# Patient Record
Sex: Male | Born: 1990 | Race: White | Hispanic: No | Marital: Single | Smoking: Current every day smoker
Health system: Southern US, Community
[De-identification: ages and names within clinical notes are randomized; demographics above are authoritative.]

---
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
## 2020-06-06 ENCOUNTER — Encounter (HOSPITAL_COMMUNITY): Payer: Self-pay | Admitting: Emergency Medicine

## 2020-06-06 ENCOUNTER — Emergency Department (HOSPITAL_COMMUNITY)
Admission: EM | Admit: 2020-06-06 | Discharge: 2020-06-06 | Payer: Self-pay | Attending: Emergency Medicine | Admitting: Emergency Medicine

## 2020-06-06 ENCOUNTER — Other Ambulatory Visit: Payer: Self-pay

## 2020-06-06 ENCOUNTER — Emergency Department (HOSPITAL_COMMUNITY): Payer: Self-pay

## 2020-06-06 DIAGNOSIS — W228XXA Striking against or struck by other objects, initial encounter: Secondary | ICD-10-CM | POA: Insufficient documentation

## 2020-06-06 DIAGNOSIS — F17200 Nicotine dependence, unspecified, uncomplicated: Secondary | ICD-10-CM | POA: Insufficient documentation

## 2020-06-06 DIAGNOSIS — S6291XP Unspecified fracture of right wrist and hand, subsequent encounter for fracture with malunion: Secondary | ICD-10-CM

## 2020-06-06 DIAGNOSIS — S60921A Unspecified superficial injury of right hand, initial encounter: Secondary | ICD-10-CM | POA: Insufficient documentation

## 2020-06-06 DIAGNOSIS — Y999 Unspecified external cause status: Secondary | ICD-10-CM | POA: Insufficient documentation

## 2020-06-06 DIAGNOSIS — Y929 Unspecified place or not applicable: Secondary | ICD-10-CM | POA: Insufficient documentation

## 2020-06-06 DIAGNOSIS — Y9389 Activity, other specified: Secondary | ICD-10-CM | POA: Insufficient documentation

## 2020-06-06 LAB — CBC WITH DIFFERENTIAL/PLATELET
Abs Immature Granulocytes: 0.03 10*3/uL (ref 0.00–0.07)
Basophils Absolute: 0.1 10*3/uL (ref 0.0–0.1)
Basophils Relative: 1 %
Eosinophils Absolute: 0.1 10*3/uL (ref 0.0–0.5)
Eosinophils Relative: 0 %
HCT: 44.9 % (ref 39.0–52.0)
Hemoglobin: 14.8 g/dL (ref 13.0–17.0)
Immature Granulocytes: 0 %
Lymphocytes Relative: 17 %
Lymphs Abs: 1.9 10*3/uL (ref 0.7–4.0)
MCH: 28.8 pg (ref 26.0–34.0)
MCHC: 33 g/dL (ref 30.0–36.0)
MCV: 87.4 fL (ref 80.0–100.0)
Monocytes Absolute: 0.8 10*3/uL (ref 0.1–1.0)
Monocytes Relative: 7 %
Neutro Abs: 8.4 10*3/uL — ABNORMAL HIGH (ref 1.7–7.7)
Neutrophils Relative %: 75 %
Platelets: 312 10*3/uL (ref 150–400)
RBC: 5.14 MIL/uL (ref 4.22–5.81)
RDW: 14.6 % (ref 11.5–15.5)
WBC: 11.2 10*3/uL — ABNORMAL HIGH (ref 4.0–10.5)
nRBC: 0 % (ref 0.0–0.2)

## 2020-06-06 LAB — COMPREHENSIVE METABOLIC PANEL
ALT: 97 U/L — ABNORMAL HIGH (ref 0–44)
AST: 81 U/L — ABNORMAL HIGH (ref 15–41)
Albumin: 4.2 g/dL (ref 3.5–5.0)
Alkaline Phosphatase: 60 U/L (ref 38–126)
Anion gap: 12 (ref 5–15)
BUN: 14 mg/dL (ref 6–20)
CO2: 20 mmol/L — ABNORMAL LOW (ref 22–32)
Calcium: 8.7 mg/dL — ABNORMAL LOW (ref 8.9–10.3)
Chloride: 107 mmol/L (ref 98–111)
Creatinine, Ser: 0.95 mg/dL (ref 0.61–1.24)
GFR calc Af Amer: >60 mL/min (ref >60)
GFR calc non Af Amer: >60 mL/min (ref >60)
Glucose, Bld: 119 mg/dL — ABNORMAL HIGH (ref 70–99)
Potassium: 3.2 mmol/L — ABNORMAL LOW (ref 3.5–5.1)
Sodium: 139 mmol/L (ref 135–145)
Total Bilirubin: 0.4 mg/dL (ref 0.3–1.2)
Total Protein: 7.4 g/dL (ref 6.5–8.1)

## 2020-06-06 LAB — ETHANOL: Alcohol, Ethyl (B): 172 mg/dL — ABNORMAL HIGH (ref <10)

## 2020-06-06 MED ORDER — HALOPERIDOL LACTATE 5 MG/ML IJ SOLN: 5.0000 mg | Freq: Once | INTRAMUSCULAR | Status: DC

## 2020-06-06 MED ORDER — POTASSIUM CHLORIDE CRYS ER 20 MEQ PO TBCR
40.0000 meq | EXTENDED_RELEASE_TABLET | Freq: Once | ORAL | Status: AC
  Administered 2020-06-06: 40 meq via ORAL
  Filled 2020-06-06: qty 2
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
## 2020-06-22 ENCOUNTER — Encounter (HOSPITAL_COMMUNITY): Payer: Self-pay

## 2020-06-22 ENCOUNTER — Emergency Department (HOSPITAL_COMMUNITY): Payer: Self-pay

## 2020-06-22 ENCOUNTER — Other Ambulatory Visit: Payer: Self-pay

## 2020-06-22 ENCOUNTER — Emergency Department (HOSPITAL_COMMUNITY)
Admission: EM | Admit: 2020-06-22 | Discharge: 2020-06-22 | Payer: Self-pay | Attending: Emergency Medicine | Admitting: Emergency Medicine

## 2020-06-22 DIAGNOSIS — IMO0002 Reserved for concepts with insufficient information to code with codable children: Secondary | ICD-10-CM

## 2020-06-22 DIAGNOSIS — Y92148 Other place in prison as the place of occurrence of the external cause: Secondary | ICD-10-CM | POA: Insufficient documentation

## 2020-06-22 DIAGNOSIS — W228XXA Striking against or struck by other objects, initial encounter: Secondary | ICD-10-CM | POA: Insufficient documentation

## 2020-06-22 DIAGNOSIS — F17200 Nicotine dependence, unspecified, uncomplicated: Secondary | ICD-10-CM | POA: Insufficient documentation

## 2020-06-22 DIAGNOSIS — F1590 Other stimulant use, unspecified, uncomplicated: Secondary | ICD-10-CM | POA: Insufficient documentation

## 2020-06-22 DIAGNOSIS — S0081XA Abrasion of other part of head, initial encounter: Secondary | ICD-10-CM | POA: Insufficient documentation

## 2020-06-22 DIAGNOSIS — Z7289 Other problems related to lifestyle: Secondary | ICD-10-CM | POA: Insufficient documentation

## 2020-06-22 DIAGNOSIS — S0083XA Contusion of other part of head, initial encounter: Secondary | ICD-10-CM | POA: Insufficient documentation

## 2020-06-22 DIAGNOSIS — F10929 Alcohol use, unspecified with intoxication, unspecified: Secondary | ICD-10-CM | POA: Insufficient documentation
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
## 2022-05-01 IMAGING — CT CT CERVICAL SPINE W/O CM
3 of 4 series · 10 of 33 positions shown, 12 images · non-contrast
Comparison: None.

CLINICAL DATA: Facial trauma

EXAM:
CT HEAD WITHOUT CONTRAST
CT CERVICAL SPINE WITHOUT CONTRAST
TECHNIQUE: Multidetector CT imaging of the head and cervical spine was
performed following the standard protocol without intravenous
contrast. Multiplanar CT image reconstructions of the cervical spine
were also generated.

[Series 4: sagittal bone · sagittal · 0.20mm/px · 5 of 61 slices shown, 6 images]
[im 21/61  bone]
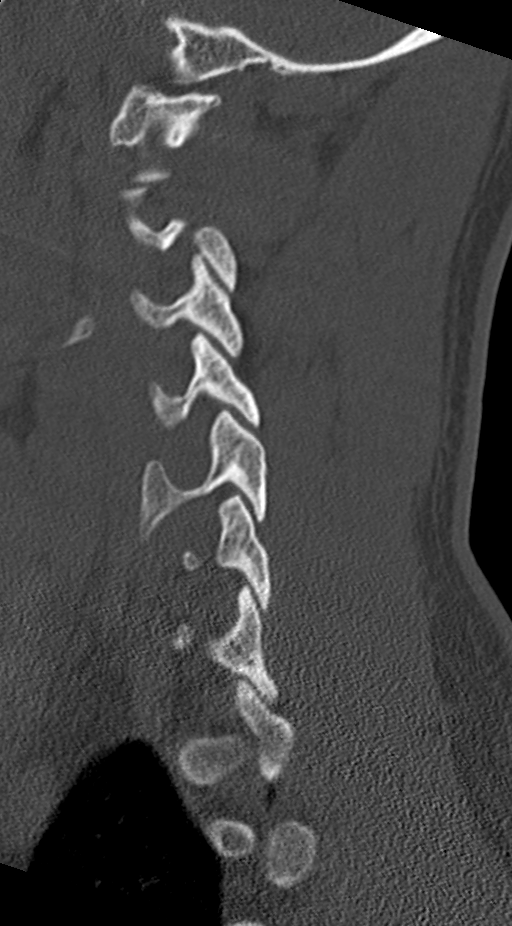
[im 26/61  bone]
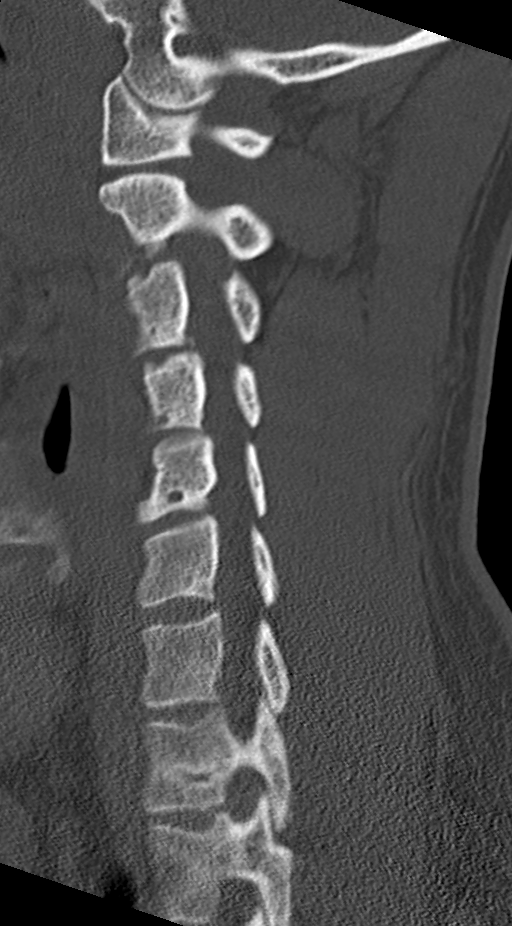
[im 31/61  soft-tissue]
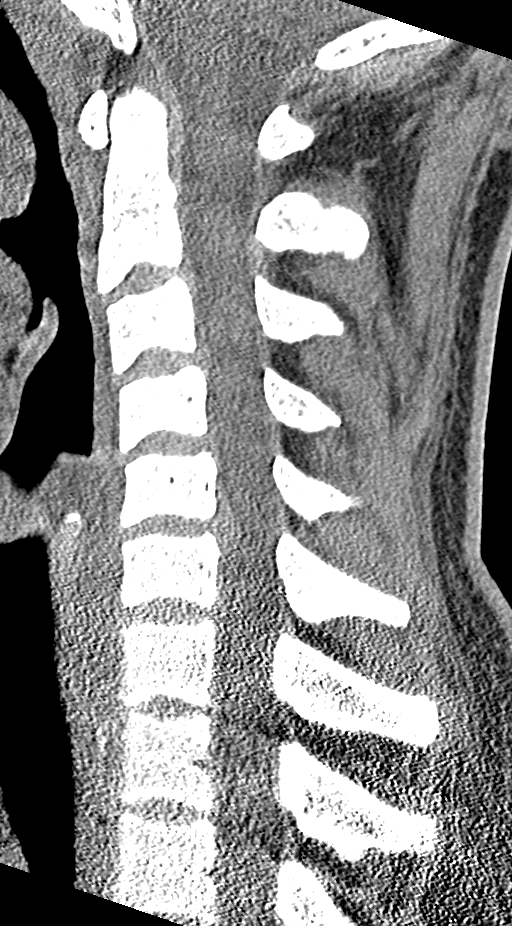
[im 31/61  bone]
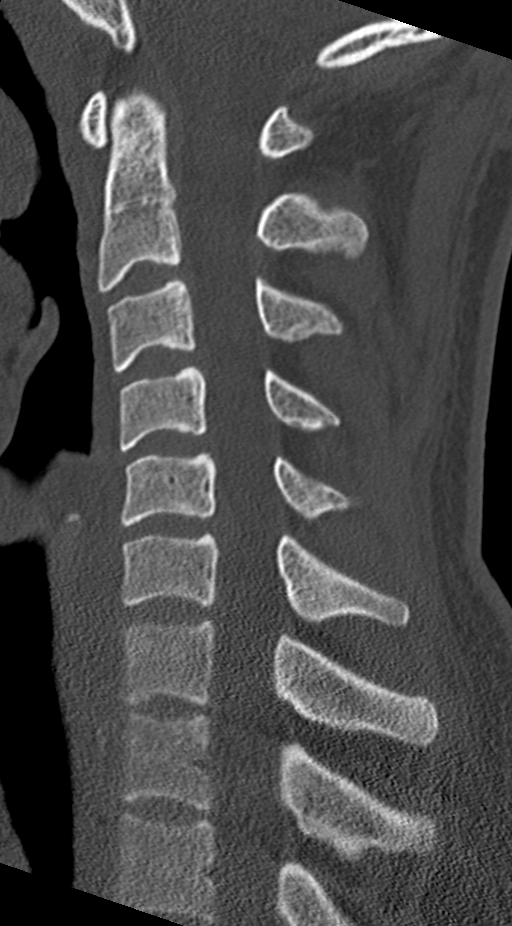
[im 36/61  bone]
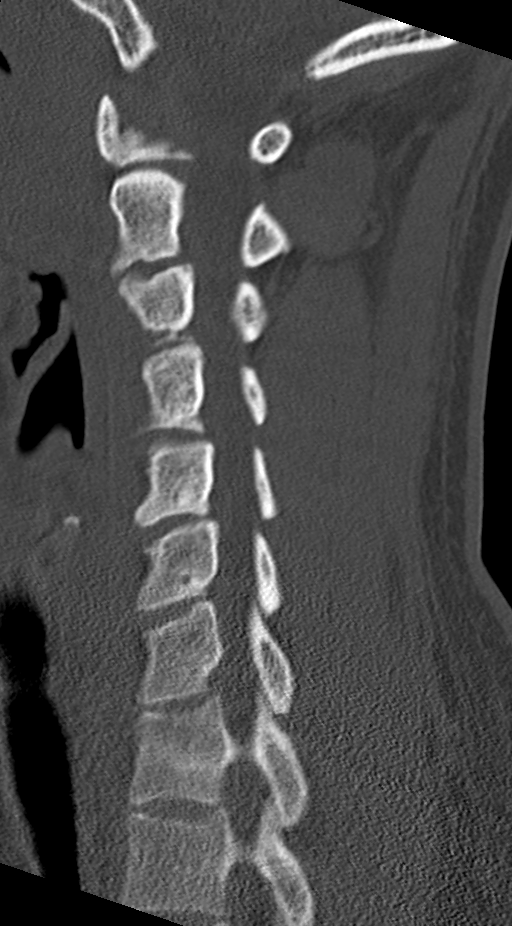
[im 41/61  bone]
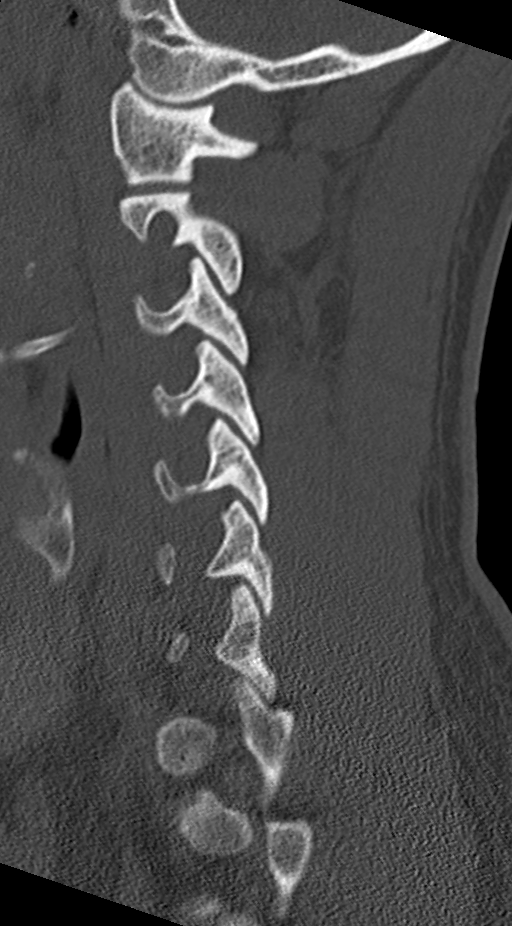

[Series 7: orthogonal bone · axial · 0.19mm/px · z∈[+48,+107]mm · 2 of 95 slices shown, 3 images]
[im 32/95  soft-tissue]
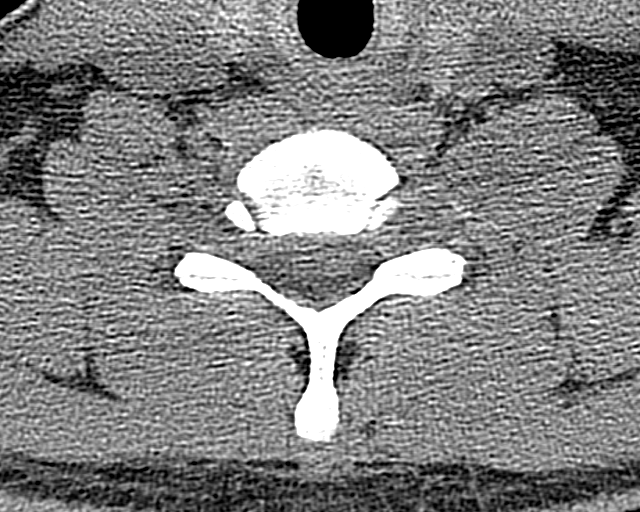
[im 32/95  bone]
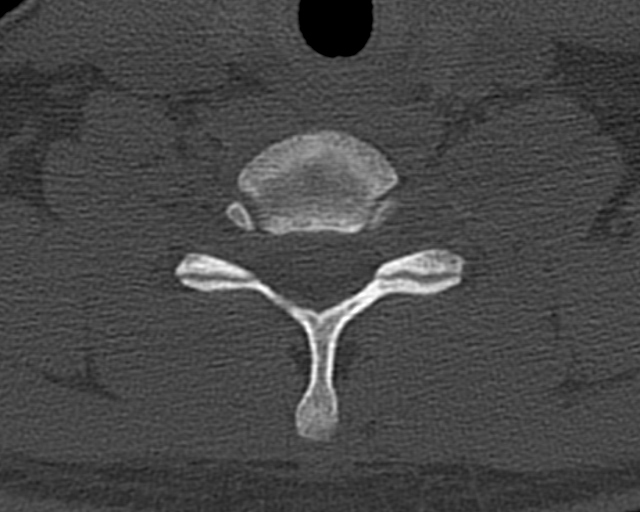
[im 63/95  bone]
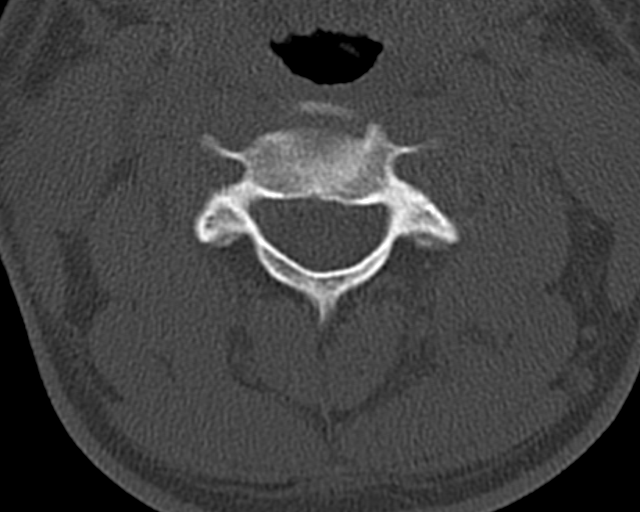

[Series 8: coronal bone · coronal · 0.28mm/px · 3 of 47 slices shown]
[im 10/47  bone]
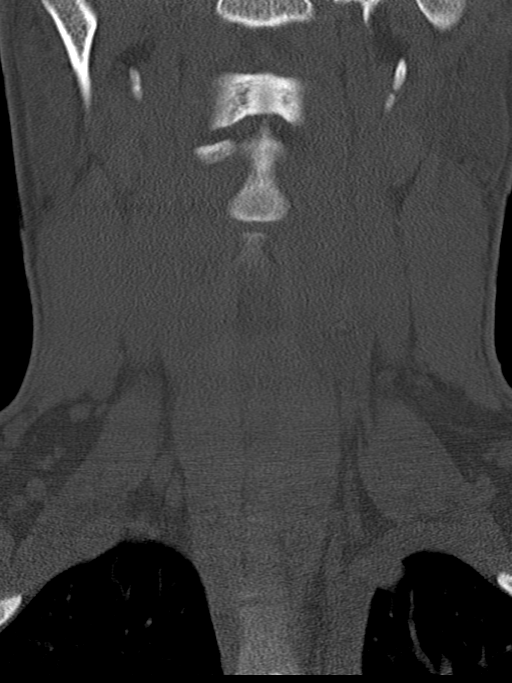
[im 19/47  bone]
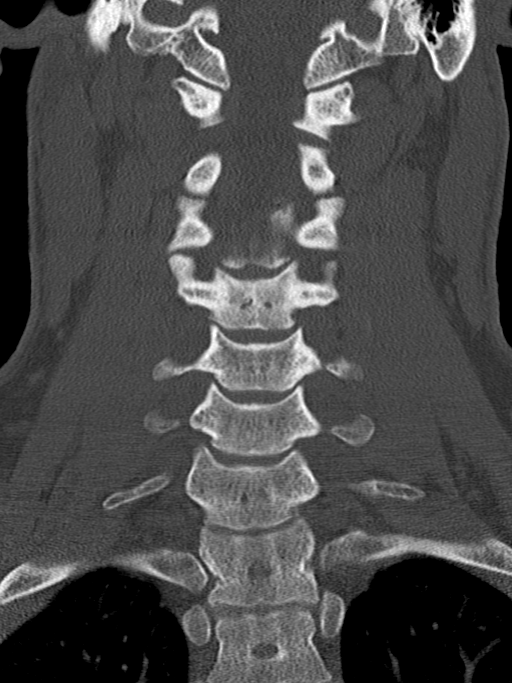
[im 28/47  bone]
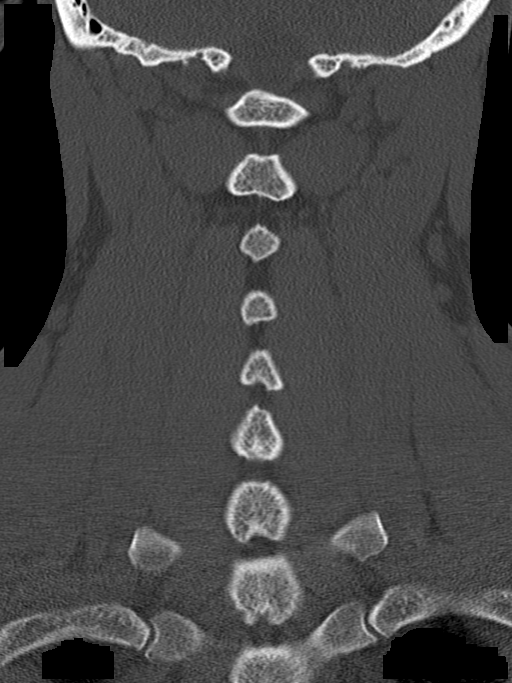

[10 of 33 positions shown; findings below may reference images not displayed]

FINDINGS: CT HEAD FINDINGS

Brain: Normal anatomic configuration. No abnormal intra or
extra-axial mass lesion or fluid collection. No abnormal mass effect
or midline shift. No evidence of acute intracranial hemorrhage or
infarct. Ventricular size is normal. Cerebellum unremarkable.

Vascular: Unremarkable

Skull: Intact

Sinuses/Orbits: Paranasal sinuses are clear. Orbits are
unremarkable.

Other: Mastoid air cells and middle ear cavities are clear. Small
frontal scalp hematoma noted. Mild soft tissue swelling superficial
to the nasion.

CT CERVICAL SPINE FINDINGS

Alignment: Normal.

Skull base and vertebrae: No acute fracture. No primary bone lesion
or focal pathologic process.

Soft tissues and spinal canal: No prevertebral fluid or swelling. No
visible canal hematoma.

Disc levels: Review of the sagittal reformats demonstrates
preservation of vertebral body height and intervertebral disc
height. Review of the axial images demonstrates a broad-based
posterior disc bulge at C5-6 resulting in mild to moderate central
canal stenosis with the AP diameter of the spinal canal of 7-8 mm
and flattening of the thecal sac. There is no significant
uncovertebral or facet arthrosis. No significant neural foraminal
narrowing.

Upper chest: Unremarkable

Other: None significant
IMPRESSION: No acute intracranial abnormality. No calvarial fracture. Small
frontal scalp hematoma.

No acute fracture of the cervical spine.

Broad-based disc bulge at C5-6 resulting in mild-to-moderate central
canal stenosis.

## 2024-10-06 ENCOUNTER — Encounter (HOSPITAL_COMMUNITY): Payer: Self-pay

## 2024-10-06 ENCOUNTER — Other Ambulatory Visit: Payer: Self-pay

## 2024-10-06 ENCOUNTER — Emergency Department (HOSPITAL_COMMUNITY)

## 2024-10-06 ENCOUNTER — Emergency Department (HOSPITAL_COMMUNITY)
Admission: EM | Admit: 2024-10-06 | Discharge: 2024-10-06 | Disposition: A | Discharge status: Home / Self Care | Attending: Emergency Medicine | Admitting: Emergency Medicine

## 2024-10-06 DIAGNOSIS — F17200 Nicotine dependence, unspecified, uncomplicated: Secondary | ICD-10-CM | POA: Diagnosis not present

## 2024-10-06 DIAGNOSIS — S51812A Laceration without foreign body of left forearm, initial encounter: Secondary | ICD-10-CM | POA: Insufficient documentation

## 2024-10-06 DIAGNOSIS — W260XXA Contact with knife, initial encounter: Secondary | ICD-10-CM | POA: Diagnosis not present

## 2024-10-06 DIAGNOSIS — S59912A Unspecified injury of left forearm, initial encounter: Secondary | ICD-10-CM | POA: Diagnosis present

## 2024-10-06 LAB — CBC WITH DIFFERENTIAL/PLATELET
Abs Immature Granulocytes: 0.03 K/uL (ref 0.00–0.07)
Basophils Absolute: 0.1 K/uL (ref 0.0–0.1)
Basophils Relative: 1 %
Eosinophils Absolute: 0.1 K/uL (ref 0.0–0.5)
Eosinophils Relative: 1 %
HCT: 44.5 % (ref 39.0–52.0)
Hemoglobin: 15 g/dL (ref 13.0–17.0)
Immature Granulocytes: 0 %
Lymphocytes Relative: 20 %
Lymphs Abs: 2 K/uL (ref 0.7–4.0)
MCH: 28.1 pg (ref 26.0–34.0)
MCHC: 33.7 g/dL (ref 30.0–36.0)
MCV: 83.5 fL (ref 80.0–100.0)
Monocytes Absolute: 0.9 K/uL (ref 0.1–1.0)
Monocytes Relative: 9 %
Neutro Abs: 6.8 K/uL (ref 1.7–7.7)
Neutrophils Relative %: 69 %
Platelets: 352 K/uL (ref 150–400)
RBC: 5.33 MIL/uL (ref 4.22–5.81)
RDW: 13.6 % (ref 11.5–15.5)
WBC: 10 K/uL (ref 4.0–10.5)
nRBC: 0 % (ref 0.0–0.2)

## 2024-10-06 LAB — BASIC METABOLIC PANEL WITH GFR
Anion gap: 15 (ref 5–15)
BUN: 10 mg/dL (ref 6–20)
CO2: 23 mmol/L (ref 22–32)
Calcium: 9.3 mg/dL (ref 8.9–10.3)
Chloride: 103 mmol/L (ref 98–111)
Creatinine, Ser: 1.05 mg/dL (ref 0.61–1.24)
GFR, Estimated: >60 mL/min
Glucose, Bld: 102 mg/dL — ABNORMAL HIGH (ref 70–99)
Potassium: 3.7 mmol/L (ref 3.5–5.1)
Sodium: 141 mmol/L (ref 135–145)

## 2024-10-06 MED ORDER — LIDOCAINE-EPINEPHRINE (PF) 2 %-1:200000 IJ SOLN
20.0000 mL | Freq: Once | INTRAMUSCULAR | Status: DC
  Filled 2024-10-06: qty 20

## 2024-10-06 MED ORDER — TETANUS-DIPHTH-ACELL PERTUSSIS 5-2-15.5 LF-MCG/0.5 IM SUSP: 0.5000 mL | Freq: Once | INTRAMUSCULAR | Status: DC

## 2024-10-06 MED ORDER — LORAZEPAM 2 MG/ML IJ SOLN
1.0000 mg | Freq: Once | INTRAMUSCULAR | Status: AC
  Administered 2024-10-06: 1 mg via INTRAVENOUS

## 2024-10-06 MED ORDER — CEPHALEXIN 500 MG PO CAPS: 500.0000 mg | ORAL_CAPSULE | Freq: Three times a day (TID) | ORAL | 0 refills | Status: AC

## 2024-10-06 MED ORDER — LIDOCAINE-EPINEPHRINE (PF) 2 %-1:200000 IJ SOLN
INTRAMUSCULAR | Status: AC
  Administered 2024-10-06: 20 mL
  Filled 2024-10-06: qty 20

## 2024-10-06 MED ORDER — LORAZEPAM 2 MG/ML IJ SOLN
INTRAMUSCULAR | Status: AC
  Filled 2024-10-06: qty 1

## 2024-10-06 MED ORDER — KETAMINE HCL 50 MG/5ML IJ SOSY
PREFILLED_SYRINGE | INTRAMUSCULAR | Status: AC
  Administered 2024-10-06: 20 mg
  Filled 2024-10-06: qty 5

## 2024-10-06 MED ORDER — IOHEXOL 350 MG/ML SOLN
100.0000 mL | Freq: Once | INTRAVENOUS | Status: AC | PRN
  Administered 2024-10-06: 100 mL via INTRAVENOUS

## 2024-10-06 NOTE — ED Provider Notes (Signed)
 LACERATION REPAIR Performed by: Margit DELENA Paris Authorized by: Margit DELENA Paris Consent: Verbal consent obtained. Risks and benefits: risks, benefits and alternatives were discussed Consent given by: patient Patient identity confirmed: provided demographic data Prepped and Draped in normal sterile fashion Wound explored  Laceration Location: left forearm, midshaft  Laceration Length: 10 cm  No Foreign Bodies seen or palpated  Anesthesia: local infiltration  Local anesthetic: lidocaine  2% w/epinephrine   Anesthetic total: 3 ml  Irrigation method: syringe Amount of cleaning: standard  Skin closure: 3-0 prolene  Number of sutures: 8  Technique: simple interrupted - LOOSE  Patient tolerance: Patient tolerated the procedure well with no immediate complications.    Paris Margit, PA-C 10/06/24 [id number]    Theadore Ozell HERO, MD 10/06/24 [phone]

## 2024-10-06 NOTE — Discharge Instructions (Addendum)
 You were evaluated in the Emergency Department and after careful evaluation, we did not find any emergent condition requiring admission or further testing in the hospital.  We controlled your bleeding here in the emergency department.  We still have some concerns for possible injury to your tendons of your forearm.  Important that you follow-up with the hand specialist for repeat evaluation within the next few days.  Call the office number later today.  Can continue Tylenol and Motrin at home for discomfort.  Recommend use of the Keflex  antibiotic to prevent infection.  Please return to the Emergency Department if you experience any worsening of your condition.   Thank you for allowing us  to be a part of your care.
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
## 2024-10-06 NOTE — ED Notes (Signed)
 Tourniquet applied to left upper arm in triage
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
## 2024-10-06 NOTE — ED Notes (Signed)
" ° °  Patient removed his PIV and walked out.  Did not want arm bandaged or splinted.  Did not want to wait for d/c paperwork.  Refused d/c vitals "
